# Patient Record
Sex: Male | Born: 1970 | Race: Black or African American | Hispanic: No | Marital: Single | Smoking: Never smoker
Health system: Southern US, Community
[De-identification: ages and names within clinical notes are randomized; demographics above are authoritative.]

---

## 2000-10-03 ENCOUNTER — Emergency Department (HOSPITAL_COMMUNITY): Admission: EM | Admit: 2000-10-03 | Discharge: 2000-10-03 | Payer: Self-pay | Admitting: Emergency Medicine

## 2000-10-03 ENCOUNTER — Encounter: Payer: Self-pay | Admitting: Emergency Medicine

## 2003-04-08 ENCOUNTER — Emergency Department (HOSPITAL_COMMUNITY): Admission: EM | Admit: 2003-04-08 | Discharge: 2003-04-08 | Payer: Self-pay | Admitting: Emergency Medicine

## 2004-03-21 ENCOUNTER — Emergency Department (HOSPITAL_COMMUNITY): Admission: EM | Admit: 2004-03-21 | Discharge: 2004-03-21 | Payer: Self-pay | Admitting: Emergency Medicine

## 2004-03-23 ENCOUNTER — Emergency Department (HOSPITAL_COMMUNITY): Admission: EM | Admit: 2004-03-23 | Discharge: 2004-03-23 | Payer: Self-pay | Admitting: Emergency Medicine

## 2004-04-08 ENCOUNTER — Encounter (INDEPENDENT_AMBULATORY_CARE_PROVIDER_SITE_OTHER): Payer: Self-pay | Admitting: Specialist

## 2004-04-08 ENCOUNTER — Ambulatory Visit (HOSPITAL_COMMUNITY): Admission: RE | Admit: 2004-04-08 | Discharge: 2004-04-08 | Payer: Self-pay | Admitting: Gastroenterology

## 2004-05-10 ENCOUNTER — Emergency Department (HOSPITAL_COMMUNITY): Admission: EM | Admit: 2004-05-10 | Discharge: 2004-05-10 | Payer: Self-pay | Admitting: Emergency Medicine

## 2004-06-22 ENCOUNTER — Emergency Department (HOSPITAL_COMMUNITY): Admission: EM | Admit: 2004-06-22 | Discharge: 2004-06-22 | Payer: Self-pay | Admitting: Emergency Medicine

## 2005-01-26 ENCOUNTER — Emergency Department (HOSPITAL_COMMUNITY): Admission: EM | Admit: 2005-01-26 | Discharge: 2005-01-26 | Payer: Self-pay | Admitting: Emergency Medicine

## 2005-11-28 ENCOUNTER — Emergency Department (HOSPITAL_COMMUNITY): Admission: EM | Admit: 2005-11-28 | Discharge: 2005-11-28 | Payer: Self-pay | Admitting: Emergency Medicine

## 2006-03-11 ENCOUNTER — Encounter (INDEPENDENT_AMBULATORY_CARE_PROVIDER_SITE_OTHER): Payer: Self-pay | Admitting: *Deleted

## 2006-03-11 ENCOUNTER — Ambulatory Visit: Payer: Self-pay | Admitting: Internal Medicine

## 2006-03-11 ENCOUNTER — Encounter: Admission: RE | Admit: 2006-03-11 | Discharge: 2006-03-11 | Payer: Self-pay | Admitting: Internal Medicine

## 2006-03-11 LAB — CONVERTED CEMR LAB: HIV 1 RNA Quant: 122000 copies/mL

## 2006-03-30 ENCOUNTER — Ambulatory Visit: Payer: Self-pay | Admitting: Internal Medicine

## 2006-04-05 DIAGNOSIS — B20 Human immunodeficiency virus [HIV] disease: Secondary | ICD-10-CM | POA: Insufficient documentation

## 2006-04-23 ENCOUNTER — Encounter (INDEPENDENT_AMBULATORY_CARE_PROVIDER_SITE_OTHER): Payer: Self-pay | Admitting: *Deleted

## 2006-04-23 ENCOUNTER — Encounter: Admission: RE | Admit: 2006-04-23 | Discharge: 2006-04-23 | Payer: Self-pay | Admitting: Internal Medicine

## 2006-04-23 ENCOUNTER — Ambulatory Visit: Payer: Self-pay | Admitting: Internal Medicine

## 2006-04-30 ENCOUNTER — Encounter: Payer: Self-pay | Admitting: Internal Medicine

## 2006-07-13 IMAGING — CR DG ANKLE COMPLETE 3+V*R*
3 series · 3 of 3 positions shown · non-contrast
Comparison: None.

CLINICAL DATA: Patient struck his ankle on a forklift and complains of medial right ankle pain. 
 RIGHT ANKLE - 3 VIEW:

[t ankle joint ap right]
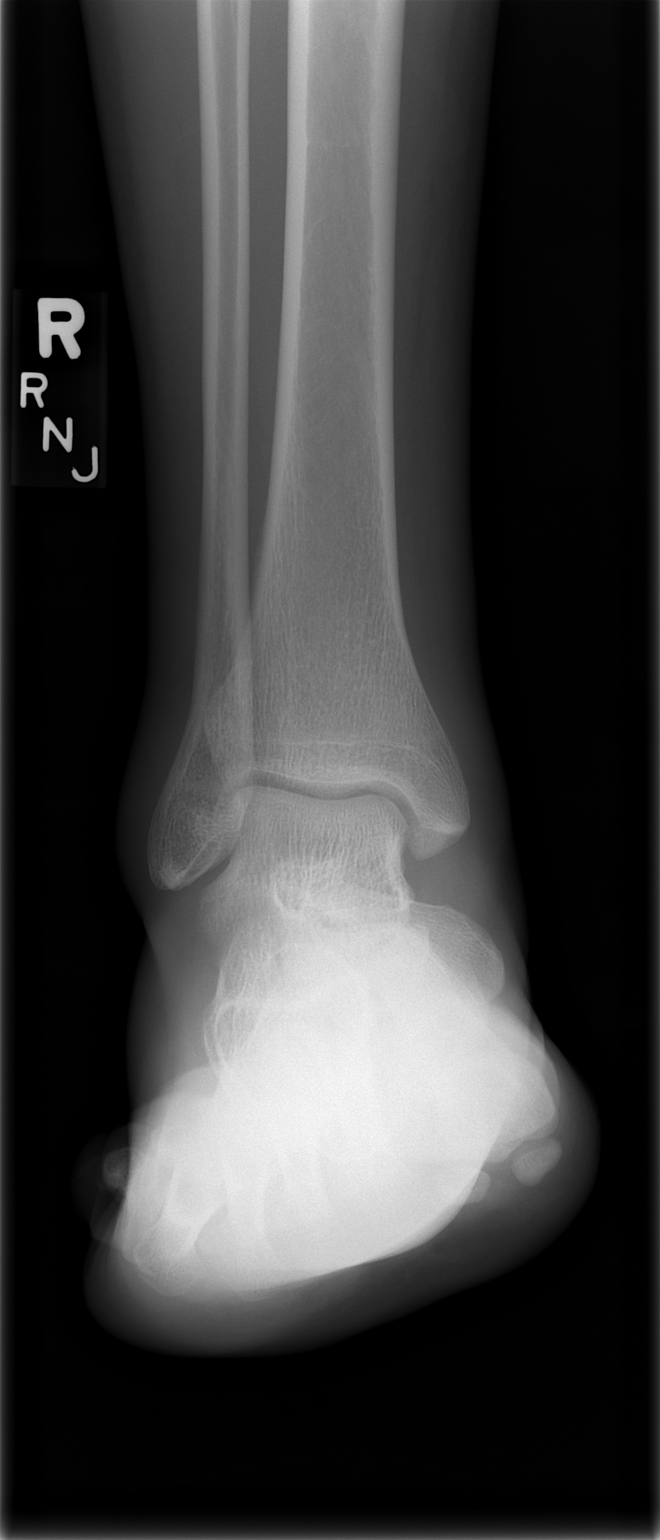

[t ankle joint oblique right]
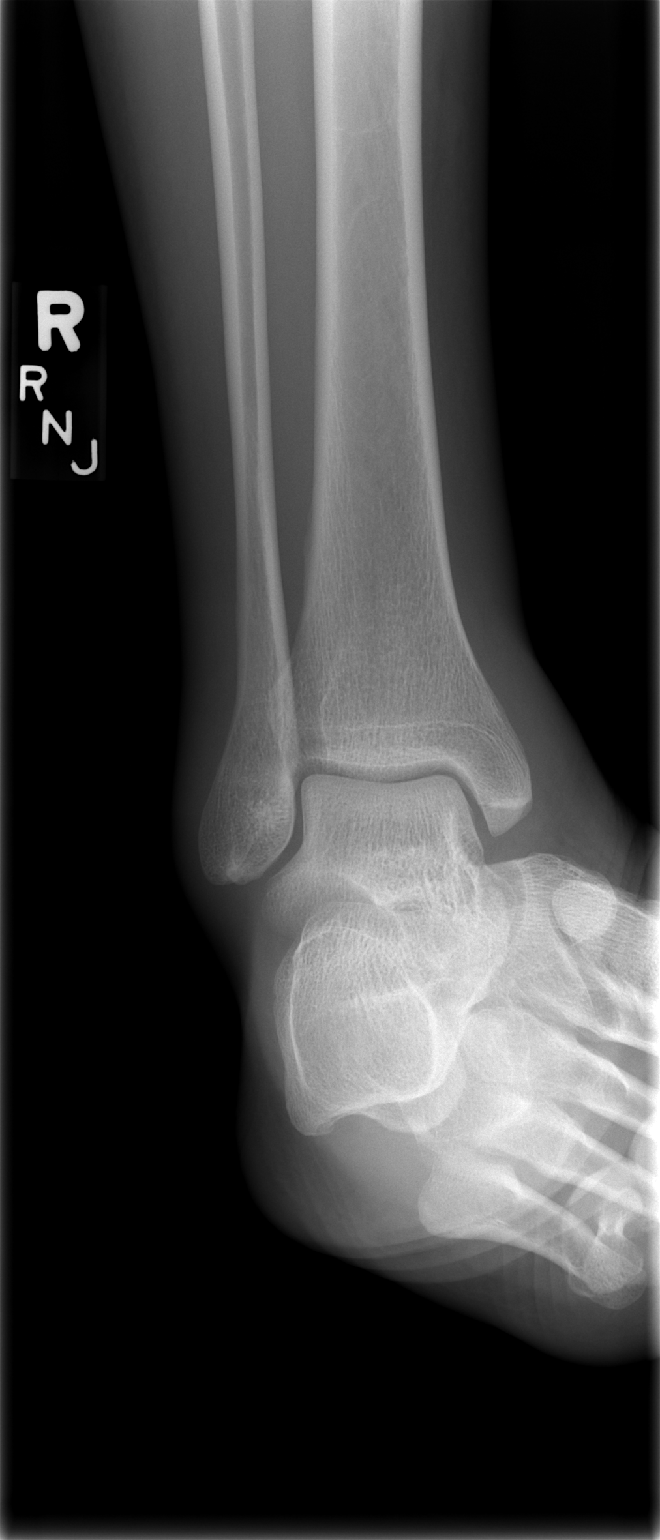

[t ankle joint lat right]
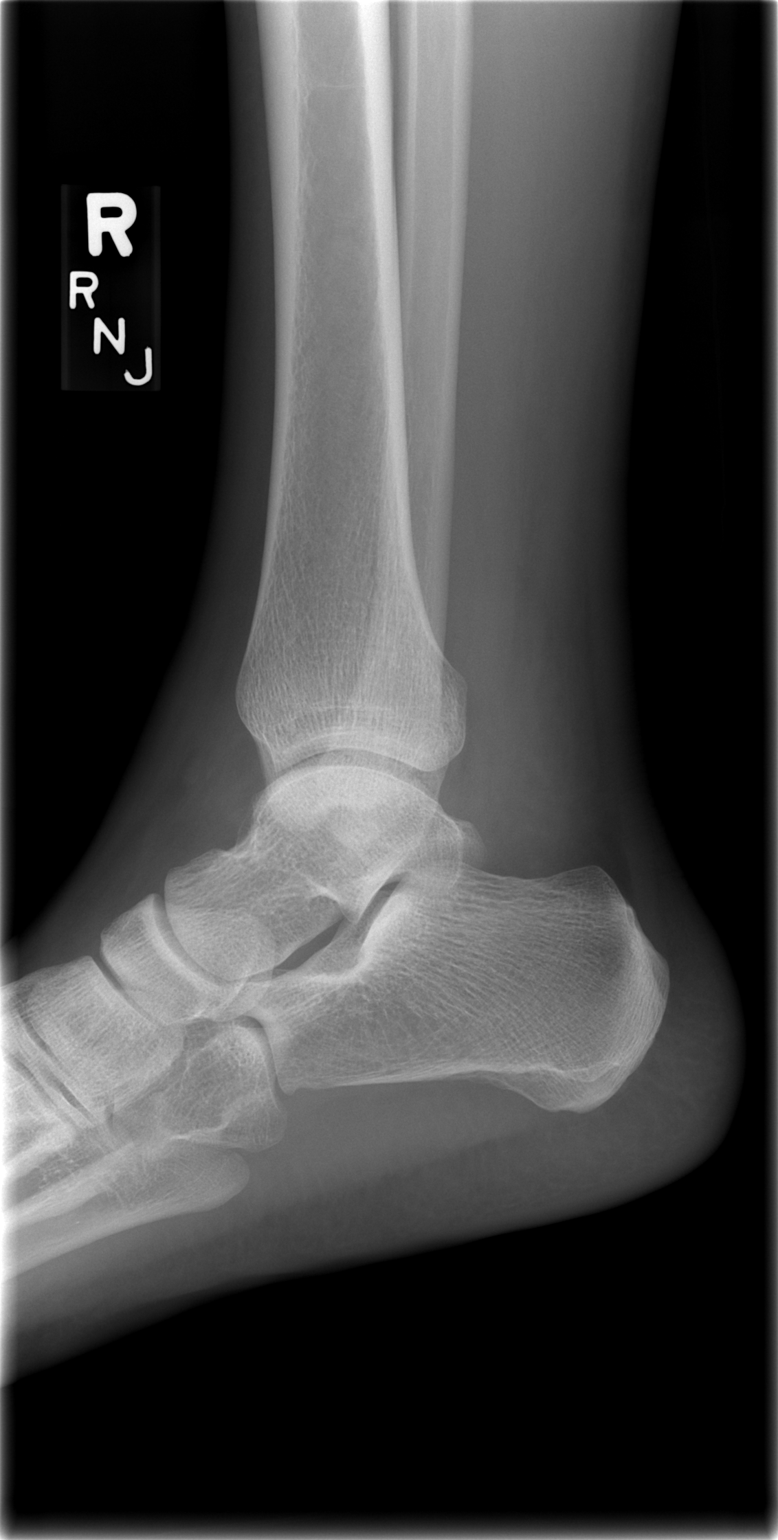

[3 of 3 positions shown; findings below may reference images not displayed]

The mortise appears intact.  Ossific density along the anterior talar neck likely represents a talar ridge and is less likely to represent a small fracture fragment.  If the patient is point tender over this region, then CT or follow-up radiography may be helpful in proving this.
IMPRESSION: No definite fracture.  Small linear density projecting over the superior talar neck is likely a ridge and less likely to represent a small bony fragment.

## 2006-07-27 DIAGNOSIS — E785 Hyperlipidemia, unspecified: Secondary | ICD-10-CM

## 2006-08-16 ENCOUNTER — Encounter (INDEPENDENT_AMBULATORY_CARE_PROVIDER_SITE_OTHER): Payer: Self-pay | Admitting: *Deleted

## 2006-08-16 LAB — CONVERTED CEMR LAB

## 2006-08-29 ENCOUNTER — Encounter (INDEPENDENT_AMBULATORY_CARE_PROVIDER_SITE_OTHER): Payer: Self-pay | Admitting: *Deleted

## 2006-09-23 ENCOUNTER — Telehealth: Payer: Self-pay | Admitting: Internal Medicine

## 2006-10-26 ENCOUNTER — Telehealth: Payer: Self-pay | Admitting: Internal Medicine

## 2007-03-22 ENCOUNTER — Telehealth: Payer: Self-pay | Admitting: Internal Medicine

## 2007-04-20 ENCOUNTER — Telehealth: Payer: Self-pay | Admitting: Internal Medicine

## 2007-10-31 ENCOUNTER — Encounter (INDEPENDENT_AMBULATORY_CARE_PROVIDER_SITE_OTHER): Payer: Self-pay | Admitting: *Deleted

## 2007-11-15 ENCOUNTER — Ambulatory Visit: Payer: Self-pay | Admitting: Internal Medicine

## 2007-11-15 ENCOUNTER — Encounter: Admission: RE | Admit: 2007-11-15 | Discharge: 2007-11-15 | Payer: Self-pay | Admitting: Internal Medicine

## 2007-11-15 LAB — CONVERTED CEMR LAB
ALT: 35 units/L (ref 0–53)
BUN: 11 mg/dL (ref 6–23)
Basophils Absolute: 0 10*3/uL (ref 0.0–0.1)
Basophils Relative: 0 % (ref 0–1)
CO2: 25 meq/L (ref 19–32)
Calcium: 9.5 mg/dL (ref 8.4–10.5)
Chloride: 102 meq/L (ref 96–112)
Cholesterol: 208 mg/dL — ABNORMAL HIGH (ref 0–200)
Creatinine, Ser: 1.13 mg/dL (ref 0.40–1.50)
Eosinophils Relative: 1 % (ref 0–5)
HCT: 48.1 % (ref 39.0–52.0)
HDL: 54 mg/dL (ref >39)
HIV 1 RNA Quant: 1000 copies/mL — ABNORMAL HIGH (ref <50)
Hemoglobin: 16.6 g/dL (ref 13.0–17.0)
Lymphocytes Relative: 29 % (ref 12–46)
MCHC: 34.5 g/dL (ref 30.0–36.0)
Monocytes Absolute: 0.6 10*3/uL (ref 0.1–1.0)
Monocytes Relative: 10 % (ref 3–12)
RBC: 5.27 M/uL (ref 4.22–5.81)
RDW: 13.5 % (ref 11.5–15.5)
Total Bilirubin: 0.6 mg/dL (ref 0.3–1.2)
Total CHOL/HDL Ratio: 3.9
VLDL: 26 mg/dL (ref 0–40)

## 2007-11-23 ENCOUNTER — Ambulatory Visit: Payer: Self-pay | Admitting: Internal Medicine

## 2007-11-23 DIAGNOSIS — Z87442 Personal history of urinary calculi: Secondary | ICD-10-CM

## 2007-11-24 ENCOUNTER — Encounter (INDEPENDENT_AMBULATORY_CARE_PROVIDER_SITE_OTHER): Payer: Self-pay | Admitting: *Deleted

## 2007-11-24 ENCOUNTER — Ambulatory Visit (HOSPITAL_COMMUNITY): Admission: RE | Admit: 2007-11-24 | Discharge: 2007-11-24 | Payer: Self-pay | Admitting: Internal Medicine

## 2007-11-29 ENCOUNTER — Telehealth: Payer: Self-pay | Admitting: Internal Medicine

## 2008-01-16 ENCOUNTER — Encounter: Payer: Self-pay | Admitting: Internal Medicine

## 2008-03-06 ENCOUNTER — Encounter: Payer: Self-pay | Admitting: Internal Medicine

## 2008-04-16 ENCOUNTER — Ambulatory Visit: Payer: Self-pay | Admitting: Internal Medicine

## 2008-04-16 LAB — CONVERTED CEMR LAB: HIV-1 RNA Quant, Log: 2.65 — ABNORMAL HIGH (ref <1.70)

## 2008-05-29 ENCOUNTER — Ambulatory Visit: Payer: Self-pay | Admitting: Internal Medicine

## 2008-05-29 DIAGNOSIS — N951 Menopausal and female climacteric states: Secondary | ICD-10-CM | POA: Insufficient documentation

## 2008-07-09 ENCOUNTER — Telehealth (INDEPENDENT_AMBULATORY_CARE_PROVIDER_SITE_OTHER): Payer: Self-pay | Admitting: *Deleted

## 2008-08-21 ENCOUNTER — Ambulatory Visit: Payer: Self-pay | Admitting: Internal Medicine

## 2008-08-21 LAB — CONVERTED CEMR LAB
Albumin: 4.3 g/dL (ref 3.5–5.2)
Alkaline Phosphatase: 69 units/L (ref 39–117)
BUN: 13 mg/dL (ref 6–23)
Basophils Absolute: 0 10*3/uL (ref 0.0–0.1)
CO2: 24 meq/L (ref 19–32)
Calcium: 9.2 mg/dL (ref 8.4–10.5)
Chloride: 104 meq/L (ref 96–112)
Eosinophils Relative: 2 % (ref 0–5)
Glucose, Bld: 103 mg/dL — ABNORMAL HIGH (ref 70–99)
HCT: 46.1 % (ref 39.0–52.0)
Hemoglobin: 16.4 g/dL (ref 13.0–17.0)
Lymphocytes Relative: 46 % (ref 12–46)
Lymphs Abs: 2.2 10*3/uL (ref 0.7–4.0)
MCV: 87 fL (ref 78.0–100.0)
Monocytes Absolute: 0.4 10*3/uL (ref 0.1–1.0)
Monocytes Relative: 8 % (ref 3–12)
Neutro Abs: 2.1 10*3/uL (ref 1.7–7.7)
Potassium: 4.5 meq/L (ref 3.5–5.3)
RBC: 5.3 M/uL (ref 4.22–5.81)
Sodium: 139 meq/L (ref 135–145)
T3 Uptake Ratio: 38.8 % — ABNORMAL HIGH (ref 22.5–37.0)
Total Protein: 7.2 g/dL (ref 6.0–8.3)
WBC: 4.9 10*3/uL (ref 4.0–10.5)

## 2008-08-28 ENCOUNTER — Ambulatory Visit: Payer: Self-pay | Admitting: Internal Medicine

## 2008-08-28 DIAGNOSIS — R5382 Chronic fatigue, unspecified: Secondary | ICD-10-CM

## 2008-10-02 ENCOUNTER — Encounter (INDEPENDENT_AMBULATORY_CARE_PROVIDER_SITE_OTHER): Payer: Self-pay | Admitting: *Deleted

## 2008-11-29 ENCOUNTER — Telehealth (INDEPENDENT_AMBULATORY_CARE_PROVIDER_SITE_OTHER): Payer: Self-pay | Admitting: *Deleted

## 2008-12-05 ENCOUNTER — Ambulatory Visit: Payer: Self-pay | Admitting: Internal Medicine

## 2008-12-05 LAB — CONVERTED CEMR LAB
Albumin: 4.2 g/dL (ref 3.5–5.2)
BUN: 9 mg/dL (ref 6–23)
Basophils Absolute: 0 10*3/uL (ref 0.0–0.1)
CO2: 22 meq/L (ref 19–32)
Cholesterol: 235 mg/dL — ABNORMAL HIGH (ref 0–200)
GFR calc Af Amer: >60 mL/min (ref >60)
GFR calc non Af Amer: >60 mL/min (ref >60)
Glucose, Bld: 102 mg/dL — ABNORMAL HIGH (ref 70–99)
HIV-1 RNA Quant, Log: 2.34 — ABNORMAL HIGH (ref <1.68)
Lymphocytes Relative: 38 % (ref 12–46)
Neutro Abs: 2.4 10*3/uL (ref 1.7–7.7)
Platelets: 206 10*3/uL (ref 150–400)
Potassium: 4.3 meq/L (ref 3.5–5.3)
RDW: 13.4 % (ref 11.5–15.5)
Sodium: 139 meq/L (ref 135–145)
Total Bilirubin: 0.3 mg/dL (ref 0.3–1.2)
Total Protein: 7.2 g/dL (ref 6.0–8.3)
Triglycerides: 208 mg/dL — ABNORMAL HIGH (ref <150)
WBC: 4.8 10*3/uL (ref 4.0–10.5)

## 2008-12-12 ENCOUNTER — Ambulatory Visit: Payer: Self-pay | Admitting: Internal Medicine

## 2008-12-12 LAB — CONVERTED CEMR LAB
Chlamydia, Swab/Urine, PCR: NEGATIVE
GC Probe Amp, Urine: NEGATIVE

## 2009-05-08 ENCOUNTER — Encounter: Payer: Self-pay | Admitting: Internal Medicine

## 2009-06-11 ENCOUNTER — Ambulatory Visit: Payer: Self-pay | Admitting: Internal Medicine

## 2009-06-11 DIAGNOSIS — K921 Melena: Secondary | ICD-10-CM | POA: Insufficient documentation

## 2009-06-11 LAB — CONVERTED CEMR LAB: HIV 1 RNA Quant: 144 copies/mL — ABNORMAL HIGH (ref <48)

## 2009-06-12 ENCOUNTER — Encounter: Payer: Self-pay | Admitting: Internal Medicine

## 2009-06-12 LAB — CONVERTED CEMR LAB
AST: 22 units/L (ref 0–37)
Albumin: 4.4 g/dL (ref 3.5–5.2)
Alkaline Phosphatase: 74 units/L (ref 39–117)
BUN: 10 mg/dL (ref 6–23)
Basophils Absolute: 0 10*3/uL (ref 0.0–0.1)
Basophils Relative: 0 % (ref 0–1)
Hemoglobin: 17.6 g/dL — ABNORMAL HIGH (ref 13.0–17.0)
MCHC: 36.3 g/dL — ABNORMAL HIGH (ref 30.0–36.0)
Monocytes Absolute: 0.5 10*3/uL (ref 0.1–1.0)
Neutro Abs: 2.5 10*3/uL (ref 1.7–7.7)
Potassium: 4.6 meq/L (ref 3.5–5.3)
RDW: 13.2 % (ref 11.5–15.5)
Sodium: 139 meq/L (ref 135–145)
Total Protein: 7.9 g/dL (ref 6.0–8.3)

## 2009-07-07 ENCOUNTER — Emergency Department (HOSPITAL_COMMUNITY): Admission: EM | Admit: 2009-07-07 | Discharge: 2009-07-07 | Payer: Self-pay | Admitting: Emergency Medicine

## 2009-07-23 ENCOUNTER — Encounter: Payer: Self-pay | Admitting: Internal Medicine

## 2009-07-24 ENCOUNTER — Encounter (INDEPENDENT_AMBULATORY_CARE_PROVIDER_SITE_OTHER): Payer: Self-pay | Admitting: *Deleted

## 2009-08-27 ENCOUNTER — Encounter (INDEPENDENT_AMBULATORY_CARE_PROVIDER_SITE_OTHER): Payer: Self-pay | Admitting: *Deleted

## 2009-10-29 ENCOUNTER — Encounter: Payer: Self-pay | Admitting: Internal Medicine

## 2010-03-13 ENCOUNTER — Ambulatory Visit: Payer: Self-pay | Admitting: Internal Medicine

## 2010-03-13 ENCOUNTER — Telehealth: Payer: Self-pay | Admitting: Internal Medicine

## 2010-03-13 LAB — CONVERTED CEMR LAB
ALT: 27 units/L (ref 0–53)
AST: 20 units/L (ref 0–37)
Basophils Absolute: 0 10*3/uL (ref 0.0–0.1)
Basophils Relative: 0 % (ref 0–1)
Creatinine, Ser: 0.99 mg/dL (ref 0.40–1.50)
Eosinophils Absolute: 0.1 10*3/uL (ref 0.0–0.7)
Eosinophils Relative: 1 % (ref 0–5)
HCT: 47 % (ref 39.0–52.0)
HIV 1 RNA Quant: 1460 copies/mL — ABNORMAL HIGH (ref <20)
Hemoglobin: 16.1 g/dL (ref 13.0–17.0)
MCHC: 34.3 g/dL (ref 30.0–36.0)
MCV: 89 fL (ref 78.0–100.0)
Monocytes Absolute: 0.5 10*3/uL (ref 0.1–1.0)
RDW: 13.3 % (ref 11.5–15.5)
Total Bilirubin: 0.4 mg/dL (ref 0.3–1.2)
Total CHOL/HDL Ratio: 5.8
VLDL: 31 mg/dL (ref 0–40)

## 2010-03-17 ENCOUNTER — Ambulatory Visit: Payer: Self-pay | Admitting: Internal Medicine

## 2010-03-17 DIAGNOSIS — N508 Other specified disorders of male genital organs: Secondary | ICD-10-CM

## 2010-03-17 LAB — CONVERTED CEMR LAB: HIV-1 RNA Quant, Log: 3.1 — ABNORMAL HIGH (ref <1.30)

## 2010-04-07 ENCOUNTER — Encounter: Payer: Self-pay | Admitting: Internal Medicine

## 2010-04-09 ENCOUNTER — Ambulatory Visit: Payer: Self-pay | Admitting: Internal Medicine

## 2010-04-09 DIAGNOSIS — A6000 Herpesviral infection of urogenital system, unspecified: Secondary | ICD-10-CM | POA: Insufficient documentation

## 2010-05-19 ENCOUNTER — Encounter (INDEPENDENT_AMBULATORY_CARE_PROVIDER_SITE_OTHER): Payer: Self-pay | Admitting: *Deleted

## 2010-05-20 ENCOUNTER — Encounter (INDEPENDENT_AMBULATORY_CARE_PROVIDER_SITE_OTHER): Payer: Self-pay | Admitting: *Deleted

## 2010-07-22 NOTE — Letter (Signed)
Summary: Aaron Tran: Verification OF Income  Aaron Tran: Verification OF Income   Imported By: Florinda Marker 08/01/2009 14:54:01  _____________________________________________________________________  External Attachment:    Type:   Image     Comment:   External Document

## 2010-07-22 NOTE — Assessment & Plan Note (Signed)
Summary: work in   CC:  pt. c/o herpes outbreak worse since finishing Acyclovir.  History of Present Illness: Pt states that his sore is getting larger and draining serosanguinous fluid with some pus. A scab will form but then come off. He took all of his acyclovir.  His viral culture came back positive for herpes.  Preventive Screening-Counseling & Management  Alcohol-Tobacco     Alcohol drinks/day: social     Alcohol type: wine     Smoking Status: never     Passive Smoke Exposure: no  Caffeine-Diet-Exercise     Caffeine use/day: no     Caffeine Counseling: decrease use of caffeine     Does Patient Exercise: yes     Type of exercise: dog walking     Exercise (avg: min/session): >60     Times/week: 7  Hep-HIV-STD-Contraception     HIV Risk: risk noted  Safety-Violence-Falls     Seat Belt Use: yes      Sexual History:  not currently in a relationship.        Drug Use:  never.     Updated Prior Medication List: LIPITOR 20 MG TABS (ATORVASTATIN CALCIUM) Take 1 tablet by mouth once a day ATRIPLA 600-200-300 MG TABS (EFAVIRENZ-EMTRICITAB-TENOFOVIR) Take 1 tablet by mouth at bedtime ZOVIRAX 400 MG TABS (ACYCLOVIR) Take 1 tablet by mouth three times a day x 5 days DOXYCYCLINE HYCLATE 100 MG CAPS (DOXYCYCLINE HYCLATE) Take 1 capsule by mouth two times a day VICODIN 5-500 MG TABS (HYDROCODONE-ACETAMINOPHEN) Take 1 tablet by mouth every 8 hours  Current Allergies (reviewed today): No known allergies  Past History:  Past Medical History: Last updated: 04/05/2006 HIV disease Hyperlipidemia  Review of Systems  The patient denies anorexia, fever, and weight loss.    Vital Signs:  Patient profile:   40 year old male Height:      71 inches (180.34 cm) Weight:      182.8 pounds (83.09 kg) BMI:     25.59 Temp:     98.3 degrees F (36.83 degrees C) oral Pulse rate:   84 / minute BP sitting:   133 / 85  (right arm)  Vitals Entered By: Wendall Mola CMA Duncan Dull)  (April 09, 2010 3:45 PM) CC: pt. c/o herpes outbreak worse since finishing Acyclovir Is Patient Diabetic? No Pain Assessment Patient in pain? yes     Location: anal Intensity: 3 Type: sore Onset of pain  Constant Nutritional Status BMI of 25 - 29 = overweight Nutritional Status Detail appetite "normal"  Does patient need assistance? Functional Status Self care Ambulation Normal Comments no missed doses of meds per pt.   Physical Exam  General:  alert, well-developed, well-nourished, and well-hydrated.   Head:  normocephalic and atraumatic.   Genitalia:  ulcer on the shaft of penis just above the head of the penis no purulence   Impression & Recommendations:  Problem # 1:  GENITAL HERPES (ICD-054.10) longer course of acyclovir treat for bacterial superinfection with Doxycycline vicodin for pain. Orders: Est. Patient Level III (37902)  Medications Added to Medication List This Visit: 1)  Doxycycline Hyclate 100 Mg Caps (Doxycycline hyclate) .... Take 1 capsule by mouth two times a day 2)  Vicodin 5-500 Mg Tabs (Hydrocodone-acetaminophen) .... Take 1 tablet by mouth every 8 hours  Patient Instructions: 1)  Please schedule a follow-up appointment in 3 weeks. Prescriptions: VICODIN 5-500 MG TABS (HYDROCODONE-ACETAMINOPHEN) Take 1 tablet by mouth every 8 hours  #30 x 0   Entered  and Authorized by:   Yisroel Ramming MD   Signed by:   Yisroel Ramming MD on 04/09/2010   Method used:   Print then Give to Patient   RxID:   [id number] DOXYCYCLINE HYCLATE 100 MG CAPS (DOXYCYCLINE HYCLATE) Take 1 capsule by mouth two times a day  #20 x 0   Entered and Authorized by:   Yisroel Ramming MD   Signed by:   Yisroel Ramming MD on 04/09/2010   Method used:   Print then Give to Patient   RxID:   [id number] ZOVIRAX 400 MG TABS (ACYCLOVIR) Take 1 tablet by mouth three times a day x 5 days  #30 x 0   Entered and Authorized by:   Yisroel Ramming MD   Signed by:   Yisroel Ramming MD on  04/09/2010   Method used:   Print then Give to Patient   RxID:   [id number]

## 2010-07-22 NOTE — Miscellaneous (Signed)
Summary: clinical update/ryan white NCADAP application completed  Clinical Lists Changes  Observations: Added new observation of FINASSESSDT: 07/23/2009 (07/24/2009 10:44) Added new observation of [place]: [place] (07/24/2009 10:44)

## 2010-07-22 NOTE — Miscellaneous (Signed)
Summary: Orders Update - labs  Clinical Lists Changes  Problems: Added new problem of ENCOUNTER FOR LONG-TERM USE OF OTHER MEDICATIONS (ICD-V58.69) Orders: Added new Test order of T-CBC w/Diff [phone]) - Signed Added new Test order of T-CD4SP [facility]) (CD4SP) - Signed Added new Test order of T-Comprehensive Metabolic Panel [phone]) - Signed Added new Test order of T-HIV Viral Load [id number]) - Signed Added new Test order of T-RPR (Syphilis) [phone]) - Signed Added new Test order of T-Lipid Profile ([id number]) - Signed     Process Orders Check Orders Results:     [facility]: ABN not required for this insurance Order queued for requisitioning for [facility]: Oct 29, 2009 5:01 PM  Tests Sent for requisitioning (Oct 29, 2009 5:01 PM):     10/30/2009: [facility] -- T-CBC w/Diff [[id number]] (signed)     10/30/2009: [facility] -- T-Comprehensive Metabolic Panel [[id number]] (signed)     10/30/2009: [facility] -- T-HIV Viral Load [phone] (signed)     10/30/2009: [facility] -- T-RPR (Syphilis) [phone] (signed)     10/30/2009: [facility] -- T-Lipid Profile [phone] (signed)

## 2010-07-22 NOTE — Miscellaneous (Signed)
  Clinical Lists Changes  Observations: Added new observation of YEARAIDSPOS: 2007  (05/20/2010 9:34) Added new observation of HIV STATUS: CDC-defined AIDS  (05/20/2010 9:34)

## 2010-07-22 NOTE — Miscellaneous (Signed)
Summary: RW FINANCIAL UPDATE   Clinical Lists Changes  Observations: Added new observation of PCTFPL: 0  (05/19/2010 9:15) Added new observation of INCOMESOURCE: NONE  (05/19/2010 9:15) Added new observation of FINASSESSDT: 04/18/2010  (05/19/2010 9:15)

## 2010-07-22 NOTE — Assessment & Plan Note (Signed)
Summary: painful sore/jc   CC:  sore on genitals x 1 week.  History of Present Illness: Pt c/o slightly painful sore on his penis.  He has not been sexually active. No penile discharge.  Preventive Screening-Counseling & Management  Alcohol-Tobacco     Alcohol drinks/day: social     Alcohol type: wine     Smoking Status: never     Passive Smoke Exposure: no  Caffeine-Diet-Exercise     Caffeine use/day: no     Caffeine Counseling: decrease use of caffeine     Does Patient Exercise: yes     Type of exercise: dog walking     Exercise (avg: min/session): >60     Times/week: 7  Hep-HIV-STD-Contraception     HIV Risk: no risk noted     HIV Risk Counseling: not indicated-no HIV risk noted  Safety-Violence-Falls     Seat Belt Use: yes      Sexual History:  not currently in a relationship.        Drug Use:  never.    Comments: pt. declined condoms   Updated Prior Medication List: LIPITOR 20 MG TABS (ATORVASTATIN CALCIUM) Take 1 tablet by mouth once a day ATRIPLA 600-200-300 MG TABS (EFAVIRENZ-EMTRICITAB-TENOFOVIR) Take 1 tablet by mouth at bedtime  Current Allergies (reviewed today): No known allergies  Past History:  Past Medical History: Last updated: 04/05/2006 HIV disease Hyperlipidemia  Review of Systems  The patient denies fever, abdominal pain, melena, and hematochezia.    Vital Signs:  Patient profile:   40 year old male Height:      71 inches (180.34 cm) Weight:      184.8 pounds (84 kg) BMI:     25.87 Temp:     98.3 degrees F (36.83 degrees C) oral Pulse rate:   80 / minute BP sitting:   130 / 88  (right arm)  Vitals Entered By: Wendall Mola CMA Duncan Dull) (March 17, 2010 2:48 PM) CC: sore on genitals x 1 week Is Patient Diabetic? No Pain Assessment Patient in pain? no      Nutritional Status BMI of 25 - 29 = overweight Nutritional Status Detail appetite "normal"  Does patient need assistance? Functional Status Self  care Ambulation Normal Comments no missed doses of Atripla per pt.   Physical Exam  General:  alert, well-developed, well-nourished, and well-hydrated.   Head:  normocephalic and atraumatic.   Genitalia:  small ulcer on right side of penis    Impression & Recommendations:  Problem # 1:  HIV DISEASE (ICD-042) VL up will get a genotype and have pt return in 3 weeks. Diagnostics Reviewed:  CD4: 510 (03/14/2010)   WBC: 5.1 (03/13/2010)   PMN (bands): 0 (08/21/2008)   Hgb: 16.1 (03/13/2010)   HCT: 47.0 (03/13/2010)   Platelets: 199 (03/13/2010) HIV-1 RNA: 1460 (03/13/2010)   HBSAg: NO (08/16/2006)  Orders: Est. 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   Immunizations Administered:  Influenza Vaccine # 1:    Vaccine Type: Fluvax Non-MCR    Site: right deltoid    Mfr: Novartis    Dose: 0.5 ml    Route: IM    Given by: Wendall Mola CMA ( AAMA)    Exp. Date:  09/21/2010    Lot #: [id number]    VIS given: 01/14/10 version given March 17, 2010.  Flu Vaccine Consent Questions:    Do you have a history of severe allergic reactions to this vaccine? no    Any prior history of allergic reactions to egg and/or gelatin? no    Do you have a sensitivity to the preservative Thimersol? no    Do you have a past history of Guillan-Barre Syndrome? no    Do you currently have an acute febrile illness? no    Have you ever had a severe reaction to latex? no    Vaccine information given and explained to patient? yes

## 2010-07-22 NOTE — Miscellaneous (Signed)
Summary: Orders Update  Clinical Lists Changes  Orders: Added new Test order of T-RPR (Syphilis) [phone]) - Signed     Process Orders Check Orders Results:     [facility]: ABN not required for this insurance Tests Sent for requisitioning (March 13, 2010 12:05 PM):     03/13/2010: [facility] -- T-RPR (Syphilis) [phone] (signed)

## 2010-07-22 NOTE — Progress Notes (Signed)
Summary: sore   Phone Note Call from Patient   Caller: Patient Summary of Call: Pt. came for labs and was c/o sore on genitals that will not heal x 6 days.  Was treated for syphillis 3/11 with Bicillian x 2 and has had no other sexual encounters since.  An RPR was done today, results should be back tomorrow. Initial call taken by: Wendall Mola CMA Duncan Dull),  March 13, 2010 12:10 PM

## 2010-07-22 NOTE — Miscellaneous (Signed)
Summary: clinical update/ryan white NCADAP appr til 09/20/10  Clinical Lists Changes  Observations: Added new observation of AIDSDAP: Yes 2011 (08/27/2009 12:27)

## 2010-08-21 ENCOUNTER — Encounter (INDEPENDENT_AMBULATORY_CARE_PROVIDER_SITE_OTHER): Payer: Self-pay | Admitting: *Deleted

## 2010-08-28 NOTE — Miscellaneous (Signed)
Summary: adap UPDATE   Clinical Lists Changes  Observations: Added new observation of AIDSDAP: Pending-APPROVAL 2012 (08/21/2010 17:17) Added new observation of FINASSESSDT: 08/20/2010 (08/21/2010 17:17)

## 2010-09-04 ENCOUNTER — Encounter (INDEPENDENT_AMBULATORY_CARE_PROVIDER_SITE_OTHER): Payer: Self-pay | Admitting: *Deleted

## 2010-09-07 LAB — HEMOCCULT GUIAC POC 1CARD (OFFICE): Fecal Occult Bld: NEGATIVE

## 2010-09-09 NOTE — Miscellaneous (Signed)
Summary: ADAP APPROVED TIL 03/22/11  Clinical Lists Changes  Observations: Added new observation of AIDSDAP: Yes 2012 (09/04/2010 18:05)

## 2010-09-22 LAB — T-HELPER CELL (CD4) - (RCID CLINIC ONLY): CD4 T Cell Abs: 580 uL (ref 400–2700)

## 2010-09-29 LAB — T-HELPER CELL (CD4) - (RCID CLINIC ONLY)
CD4 % Helper T Cell: 29 % — ABNORMAL LOW (ref 33–55)
CD4 T Cell Abs: 480 uL (ref 400–2700)

## 2010-10-13 ENCOUNTER — Telehealth: Payer: Self-pay | Admitting: *Deleted

## 2010-10-13 NOTE — Telephone Encounter (Signed)
RN phoned [employer] [facility] [phone]).  Pt requesting refill for valacyclovir which is not on the pt's active or discontinued medication list.  Pharmacist, Malachi Bonds, with [facility] checked the prescription and found that the valacyclovir had been prescribed by a Dr. Nedra Hai in [place], [place] in March 2012.  Pharmacist Malachi Bonds stated that she would contact the pt about this refill request.  RN requested that Malachi Bonds tell the pt that is time to come to the Center for lab work and to see the MD and that the Center would not be refilling the valacyclovir request.  Jennet Maduro, RN

## 2010-10-28 ENCOUNTER — Other Ambulatory Visit: Payer: Self-pay | Admitting: Licensed Clinical Social Worker

## 2010-10-28 ENCOUNTER — Other Ambulatory Visit: Payer: Self-pay | Admitting: Infectious Diseases

## 2010-10-28 ENCOUNTER — Other Ambulatory Visit (INDEPENDENT_AMBULATORY_CARE_PROVIDER_SITE_OTHER): Payer: Self-pay

## 2010-10-28 DIAGNOSIS — B20 Human immunodeficiency virus [HIV] disease: Secondary | ICD-10-CM

## 2010-10-28 LAB — CBC WITH DIFFERENTIAL/PLATELET
Basophils Relative: 1 % (ref 0–1)
Eosinophils Absolute: 0.1 10*3/uL (ref 0.0–0.7)
Eosinophils Relative: 2 % (ref 0–5)
Hemoglobin: 16.4 g/dL (ref 13.0–17.0)
Lymphs Abs: 1.9 10*3/uL (ref 0.7–4.0)
MCH: 31.1 pg (ref 26.0–34.0)
MCHC: 35.1 g/dL (ref 30.0–36.0)
MCV: 88.4 fL (ref 78.0–100.0)
Monocytes Relative: 6 % (ref 3–12)
RBC: 5.28 MIL/uL (ref 4.22–5.81)

## 2010-10-28 LAB — COMPREHENSIVE METABOLIC PANEL
ALT: 36 U/L (ref 0–53)
AST: 22 U/L (ref 0–37)
CO2: 24 mEq/L (ref 19–32)
Calcium: 9.5 mg/dL (ref 8.4–10.5)
Chloride: 105 mEq/L (ref 96–112)
Creat: 0.97 mg/dL (ref 0.40–1.50)
Sodium: 138 mEq/L (ref 135–145)
Total Bilirubin: 0.3 mg/dL (ref 0.3–1.2)
Total Protein: 7.5 g/dL (ref 6.0–8.3)

## 2010-10-29 ENCOUNTER — Other Ambulatory Visit: Payer: Self-pay | Admitting: Licensed Clinical Social Worker

## 2010-10-29 DIAGNOSIS — B20 Human immunodeficiency virus [HIV] disease: Secondary | ICD-10-CM

## 2010-10-29 LAB — HIV-1 RNA QUANT-NO REFLEX-BLD: HIV 1 RNA Quant: 435 copies/mL — ABNORMAL HIGH (ref <20)

## 2010-10-29 MED ORDER — ACYCLOVIR 400 MG PO TABS: 400.0000 mg | ORAL_TABLET | Freq: Three times a day (TID) | ORAL | Status: DC

## 2010-11-07 NOTE — Op Note (Signed)
NAME:  Aaron Tran, Aaron Tran               ACCOUNT NO.:  [id number]   MEDICAL RECORD NO.:  [id number]          PATIENT TYPE:  AMB   LOCATION:  ENDO                         FACILITY:  [facility]   PHYSICIAN:  Jordan Hawks. Elnoria Howard, MD    DATE OF BIRTH:  06/01/1971   DATE OF PROCEDURE:  04/08/2004  DATE OF DISCHARGE:                                 OPERATIVE REPORT   PROCEDURE:  Colonoscopy.   ENDOSCOPIST:  Jordan Hawks. Elnoria Howard, M.D.   INDICATIONS FOR PROCEDURE:  Abnormal CT scan and heme-positive stool.   INFORMED CONSENT:  An informed consent was obtained from the patient,  describing the risks of bleeding, infection, perforation, medication  reactions, a 10% rate for missing a small colon cancer or polyp and the  risks of death, all of which are not exclusive of any other complications  that can occur.   EQUIPMENT USED:  Olympus adult colonoscope.   PHYSICAL EXAMINATION:  HEART:  Regular rate and rhythm.  LUNGS:  Clear to auscultation bilaterally.  ABDOMEN:  Soft, nontender, nondistended.  Positive bowel sounds.   MEDICATIONS:  Versed 10 mg IV, Demerol 100 mg IV.   DESCRIPTION OF PROCEDURE:  After adequate sedation was achieved, while in  the left lateral decubitus position, a rectal examination was performed,  which was negative for any abnormalities.  The colonoscope was then  introduced from the anus and advanced to the terminal ilium under direct  visualization.  The patient was noted to have an excellent prep.  While in  the terminal ileum, there was no evidence of any erosions or ulcerations or  strictures.  There was a mild erythema in two to three areas, which were  biopsied.  Upon withdrawal of the colonoscope into the cecum, the patient  was noted to have marked lymphoid aggregates that were seen.  This area was  biopsied in the cecum; however, this appeared to be a normal variant.  An  ulceration was noted on the cecal cap directly adjacent to the terminal  ileum.  Multiple biopsies of  this ulceration were performed.  Upon  withdrawal of the colonoscope into the ascending colon, transverse colon and  descending colon there were no abnormalities that were detected.  Random  biopsies were obtained in these areas.  Upon further withdrawal of the  colonoscope into the rectosigmoid region at approximately 20 cm, the patient  was noted to have possible aphthous ulcers.  This was difficult to discern,  and it is uncertain if this was also representative of any type of lymphoid  hyperplasia.  Biopsies of these areas were obtained.  Upon retroflexion in  the rectum, the patient is documented to have internal and external  hemorrhoids, moderate in size.  No other abnormalities were seen.  The  colonoscope was then withdrawn, and the procedure was terminated.  The patient tolerated the procedure well.  No complications were  encountered.   PLAN:  1.  To follow up on the biopsies.  2.  To return to the clinic as previously scheduled.       PDH/MEDQ  D:  04/08/2004  T:  04/08/2004  Job:  [id number]   cc:   Jeoffrey Massed, M.D.  [address]  [place]  [place] [address]  Fax: [phone]

## 2010-11-10 ENCOUNTER — Ambulatory Visit: Payer: Self-pay | Admitting: Infectious Disease

## 2011-03-18 LAB — T-HELPER CELL (CD4) - (RCID CLINIC ONLY)
CD4 % Helper T Cell: 27 — ABNORMAL LOW
CD4 T Cell Abs: 360 — ABNORMAL LOW

## 2011-03-20 ENCOUNTER — Telehealth: Payer: Self-pay

## 2011-03-20 NOTE — Telephone Encounter (Signed)
Called patient to remind them of ADAP / RW renewal - patient lives in [place], so may have care there - left msg for patient to call.

## 2011-03-23 LAB — T-HELPER CELL (CD4) - (RCID CLINIC ONLY): CD4 T Cell Abs: 590

## 2011-03-25 ENCOUNTER — Other Ambulatory Visit: Payer: Self-pay | Admitting: Licensed Clinical Social Worker

## 2011-03-25 ENCOUNTER — Ambulatory Visit: Payer: Self-pay

## 2011-03-25 DIAGNOSIS — B20 Human immunodeficiency virus [HIV] disease: Secondary | ICD-10-CM

## 2011-03-25 MED ORDER — EFAVIRENZ-EMTRICITAB-TENOFOVIR 600-200-300 MG PO TABS: 1.0000 | ORAL_TABLET | Freq: Every day | ORAL | Status: DC

## 2011-05-29 ENCOUNTER — Other Ambulatory Visit: Payer: Self-pay | Admitting: Licensed Clinical Social Worker

## 2011-05-29 ENCOUNTER — Other Ambulatory Visit: Payer: Self-pay | Admitting: *Deleted

## 2011-05-29 DIAGNOSIS — B20 Human immunodeficiency virus [HIV] disease: Secondary | ICD-10-CM

## 2011-05-29 MED ORDER — EFAVIRENZ-EMTRICITAB-TENOFOVIR 600-200-300 MG PO TABS: 1.0000 | ORAL_TABLET | Freq: Every day | ORAL | Status: DC

## 2011-06-01 ENCOUNTER — Other Ambulatory Visit: Payer: Self-pay | Admitting: *Deleted

## 2011-06-01 DIAGNOSIS — B20 Human immunodeficiency virus [HIV] disease: Secondary | ICD-10-CM

## 2011-06-01 DIAGNOSIS — E7800 Pure hypercholesterolemia, unspecified: Secondary | ICD-10-CM

## 2011-06-01 DIAGNOSIS — B009 Herpesviral infection, unspecified: Secondary | ICD-10-CM

## 2011-06-01 MED ORDER — EFAVIRENZ-EMTRICITAB-TENOFOVIR 600-200-300 MG PO TABS: 1.0000 | ORAL_TABLET | Freq: Every day | ORAL | Status: AC

## 2011-06-01 MED ORDER — ACYCLOVIR 400 MG PO TABS: 400.0000 mg | ORAL_TABLET | Freq: Three times a day (TID) | ORAL | Status: AC

## 2011-06-01 MED ORDER — ATORVASTATIN CALCIUM 20 MG PO TABS: 20.0000 mg | ORAL_TABLET | Freq: Every day | ORAL | Status: AC

## 2011-06-01 NOTE — Telephone Encounter (Signed)
Patient left message on vm - prescriptions need to be refilled for atripla - let [place] know - also let patient know he needs to call Dr to refill prescriptions - he is approved for adap through 09/20/11.

## 2011-07-06 ENCOUNTER — Telehealth: Payer: Self-pay | Admitting: *Deleted

## 2011-07-06 NOTE — Telephone Encounter (Signed)
He lives there now. New md wants records before they will make him an appt. Asked him to go to that md office & sign a ROI. I gave him our fax so they can send it here

## 2016-07-17 ENCOUNTER — Encounter: Payer: Self-pay | Admitting: Gastroenterology

## 2016-08-20 ENCOUNTER — Ambulatory Visit: Payer: Self-pay | Admitting: Gastroenterology

## 2016-08-20 ENCOUNTER — Other Ambulatory Visit: Payer: Self-pay

## 2019-11-01 ENCOUNTER — Ambulatory Visit: Payer: Self-pay

## 2020-02-08 ENCOUNTER — Ambulatory Visit: Payer: Self-pay | Admitting: Physician Assistant

## 2020-02-08 ENCOUNTER — Other Ambulatory Visit: Payer: Self-pay

## 2020-02-08 DIAGNOSIS — Z113 Encounter for screening for infections with a predominantly sexual mode of transmission: Secondary | ICD-10-CM

## 2020-02-09 ENCOUNTER — Encounter: Payer: Self-pay | Admitting: Physician Assistant

## 2020-02-09 NOTE — Progress Notes (Signed)
[facility] Department STI clinic/screening visit  Subjective:  KEMO SPRUCE is a 49 y.o. male being seen today for an STI screening visit. The patient reports they do have symptoms.    Patient has the following medical conditions:   Patient Active Problem List   Diagnosis Date Noted  . GENITAL HERPES 04/09/2010  . 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 BLOOD IN STOOL 06/11/2009  . CHRONIC FATIGUE SYNDROME 08/28/2008  . HOT FLASHES 05/29/2008  . 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 HYPERLIPIDEMIA 07/27/2006  . HIV DISEASE 04/05/2006     Chief Complaint  Patient presents with  . SEXUALLY TRANSMITTED DISEASE    screening    HPI  Patient reports that he has had a "rash" on his penis since 02/04/2020, without tenderness or itching.  States that it almost looked like a blister at first but not like HSV outbreak.  Reports that he is taking medicines for HIV, HSV and cholesterol.  Last void prior to sample collection for GC/Chlamydia was over 2 hr ago.   See flowsheet for further details and programmatic requirements.    The following portions of the patient's history were reviewed and updated as appropriate: allergies, current medications, past medical history, past social history, past surgical history and problem list.  Objective:  There were no vitals filed for this visit.  Physical Exam Constitutional:      General: He is not in acute distress.    Appearance: Normal appearance.  HENT:     Head: Normocephalic and atraumatic.     Comments: No nits, lice or hair loss. No cervical, supraclavicular or axillary adenopathy.    Mouth/Throat:     Mouth: Mucous membranes are moist.     Pharynx: Oropharynx is clear. No oropharyngeal exudate or posterior oropharyngeal erythema.  Eyes:     Conjunctiva/sclera: Conjunctivae normal.  Pulmonary:     Effort: Pulmonary effort is normal.  Abdominal:     Palpations: Abdomen is soft. There is no  mass.     Tenderness: There is no abdominal tenderness. There is no guarding or rebound.  Genitourinary:    Penis: Normal.      Testes: Normal.     Comments: Pubic area without nits, lice, edema, erythema, lesions and inguinal adenopathy. Penis circumcised, without discharge at meatus. Shaft just below corona with ~1.5 cm, scaly, non tender, non ulcerative area on left side. Musculoskeletal:     Cervical back: Neck supple. No tenderness.  Skin:    General: Skin is warm and dry.     Findings: No bruising, erythema, lesion or rash.  Neurological:     Mental Status: He is alert and oriented to person, place, and time.  Psychiatric:        Mood and Affect: Mood normal.        Behavior: Behavior normal.        Thought Content: Thought content normal.        Judgment: Judgment normal.       Assessment and Plan:  TAMARIUS ROSENFIELD is a 49 y.o. male presenting to the [facility] Department for STI screening  1. Screening for STD (sexually transmitted disease) Patient into clinic with symptoms. Counseled patient that it is hard to tell what lesion was caused by since it has dried up/healed. Counseled that it could be Syphilis, dermatitis, or other cause but we will have to wait for results. Rec no sex until after results are in. Rec condoms  with all sex. Await test results.  Counseled that RN will call if needs to RTC for treatment once results are back. - Chlamydia/Gonorrhea [facility] - Syphilis Serology, [facility]     No follow-ups on file.  No future appointments.  Matt Holmes, PA

## 2020-11-26 ENCOUNTER — Ambulatory Visit: Payer: Medicaid Other

## 2020-12-03 ENCOUNTER — Ambulatory Visit: Payer: 59

## 2020-12-05 ENCOUNTER — Ambulatory Visit: Payer: 59

## 2021-05-09 ENCOUNTER — Other Ambulatory Visit (HOSPITAL_COMMUNITY): Payer: Self-pay

## 2021-05-09 ENCOUNTER — Ambulatory Visit: Payer: Self-pay | Admitting: Infectious Disease

## 2021-05-09 ENCOUNTER — Telehealth: Payer: Self-pay

## 2021-05-09 NOTE — Telephone Encounter (Signed)
[facility] Patient Product/process development scientist completed.    The patient is insured through [employer] and has a 65.00 copay.  Patient will need a copay coupon card.   Renaldo Harrison will need a PA.  We will continue to follow to see if copay assistance is needed.  Clearance Coots, CPhT Specialty Pharmacy Patient [facility] for Infectious Disease Phone: [phone] Fax:  [phone]
